# Patient Record
Sex: Female | Born: 1980 | Hispanic: Yes | Marital: Married | Smoking: Never smoker
Health system: Southern US, Community
[De-identification: ages and names within clinical notes are randomized; demographics above are authoritative.]

## PROBLEM LIST (undated history)

## (undated) DIAGNOSIS — K069 Disorder of gingiva and edentulous alveolar ridge, unspecified: Secondary | ICD-10-CM

---

## 2014-03-07 ENCOUNTER — Emergency Department (HOSPITAL_COMMUNITY)
Admission: EM | Admit: 2014-03-07 | Discharge: 2014-03-07 | Disposition: A | Discharge status: Home / Self Care | Payer: Self-pay | Attending: Emergency Medicine | Admitting: Emergency Medicine

## 2014-03-07 ENCOUNTER — Encounter (HOSPITAL_COMMUNITY): Payer: Self-pay | Admitting: Emergency Medicine

## 2014-03-07 ENCOUNTER — Emergency Department (HOSPITAL_COMMUNITY): Payer: Self-pay

## 2014-03-07 ENCOUNTER — Ambulatory Visit (INDEPENDENT_AMBULATORY_CARE_PROVIDER_SITE_OTHER): Payer: Self-pay | Admitting: Internal Medicine

## 2014-03-07 ENCOUNTER — Encounter: Payer: Self-pay | Admitting: Internal Medicine

## 2014-03-07 VITALS — BP 98/62 | HR 99 | Temp 98.4°F | Resp 10

## 2014-03-07 DIAGNOSIS — R55 Syncope and collapse: Secondary | ICD-10-CM | POA: Insufficient documentation

## 2014-03-07 DIAGNOSIS — R404 Transient alteration of awareness: Secondary | ICD-10-CM | POA: Insufficient documentation

## 2014-03-07 DIAGNOSIS — R6889 Other general symptoms and signs: Secondary | ICD-10-CM

## 2014-03-07 DIAGNOSIS — Z79899 Other long term (current) drug therapy: Secondary | ICD-10-CM | POA: Insufficient documentation

## 2014-03-07 DIAGNOSIS — R51 Headache: Secondary | ICD-10-CM

## 2014-03-07 DIAGNOSIS — R6883 Chills (without fever): Secondary | ICD-10-CM

## 2014-03-07 DIAGNOSIS — R42 Dizziness and giddiness: Secondary | ICD-10-CM | POA: Insufficient documentation

## 2014-03-07 DIAGNOSIS — R110 Nausea: Secondary | ICD-10-CM | POA: Insufficient documentation

## 2014-03-07 DIAGNOSIS — R4020 Unspecified coma: Secondary | ICD-10-CM

## 2014-03-07 DIAGNOSIS — R410 Disorientation, unspecified: Secondary | ICD-10-CM

## 2014-03-07 DIAGNOSIS — Z3202 Encounter for pregnancy test, result negative: Secondary | ICD-10-CM | POA: Insufficient documentation

## 2014-03-07 HISTORY — DX: Disorder of gingiva and edentulous alveolar ridge, unspecified: K06.9

## 2014-03-07 LAB — CBC WITH DIFFERENTIAL/PLATELET
Basophils Absolute: 0 10*3/uL (ref 0.0–0.1)
Basophils Relative: 0 % (ref 0–1)
Eosinophils Absolute: 0.1 10*3/uL (ref 0.0–0.7)
Eosinophils Relative: 1 % (ref 0–5)
HCT: 41.3 % (ref 36.0–46.0)
HEMOGLOBIN: 13.2 g/dL (ref 12.0–15.0)
LYMPHS ABS: 2.5 10*3/uL (ref 0.7–4.0)
LYMPHS PCT: 28 % (ref 12–46)
MCH: 24.8 pg — ABNORMAL LOW (ref 26.0–34.0)
MCHC: 32 g/dL (ref 30.0–36.0)
MCV: 77.5 fL — ABNORMAL LOW (ref 78.0–100.0)
Monocytes Absolute: 0.5 10*3/uL (ref 0.1–1.0)
Monocytes Relative: 6 % (ref 3–12)
NEUTROS PCT: 65 % (ref 43–77)
Neutro Abs: 5.9 10*3/uL (ref 1.7–7.7)
Platelets: 228 10*3/uL (ref 150–400)
RBC: 5.33 MIL/uL — AB (ref 3.87–5.11)
RDW: 13.5 % (ref 11.5–15.5)
WBC: 9 10*3/uL (ref 4.0–10.5)

## 2014-03-07 LAB — URINALYSIS, ROUTINE W REFLEX MICROSCOPIC
Bilirubin Urine: NEGATIVE
Glucose, UA: NEGATIVE mg/dL
HGB URINE DIPSTICK: NEGATIVE
Ketones, ur: NEGATIVE mg/dL
Leukocytes, UA: NEGATIVE
Nitrite: NEGATIVE
PH: 6 (ref 5.0–8.0)
Protein, ur: NEGATIVE mg/dL
SPECIFIC GRAVITY, URINE: 1.009 (ref 1.005–1.030)
UROBILINOGEN UA: 0.2 mg/dL (ref 0.0–1.0)

## 2014-03-07 LAB — I-STAT CHEM 8, ED
BUN: 6 mg/dL (ref 6–23)
CREATININE: 0.6 mg/dL (ref 0.50–1.10)
Calcium, Ion: 1.16 mmol/L (ref 1.12–1.23)
Chloride: 108 mEq/L (ref 96–112)
Glucose, Bld: 154 mg/dL — ABNORMAL HIGH (ref 70–99)
HCT: 42 % (ref 36.0–46.0)
HEMOGLOBIN: 14.3 g/dL (ref 12.0–15.0)
POTASSIUM: 3.9 meq/L (ref 3.7–5.3)
Sodium: 144 mEq/L (ref 137–147)
TCO2: 22 mmol/L (ref 0–100)

## 2014-03-07 LAB — PREGNANCY, URINE: PREG TEST UR: NEGATIVE

## 2014-03-07 MED ORDER — SODIUM CHLORIDE 0.9 % IV SOLN
1000.0000 mL | Freq: Once | INTRAVENOUS | Status: DC
Start: 2014-03-07 — End: 2014-03-07

## 2014-03-07 MED ORDER — ONDANSETRON HCL 4 MG/2ML IJ SOLN: 4.0000 mg | Freq: Once | INTRAMUSCULAR | Status: DC | PRN

## 2014-03-07 MED ORDER — MECLIZINE HCL 25 MG PO TABS: 25.0000 mg | ORAL_TABLET | Freq: Three times a day (TID) | ORAL | Status: AC | PRN

## 2014-03-07 MED ORDER — SODIUM CHLORIDE 0.9 % IV SOLN
1000.0000 mL | Freq: Once | INTRAVENOUS | Status: AC
Start: 2014-03-07 — End: 2014-03-07
  Administered 2014-03-07: 1000 mL via INTRAVENOUS

## 2014-03-07 MED ORDER — SODIUM CHLORIDE 0.9 % IV SOLN: 1000.0000 mL | INTRAVENOUS | Status: DC

## 2014-03-07 MED ORDER — ONDANSETRON HCL 4 MG PO TABS: 4.0000 mg | ORAL_TABLET | Freq: Four times a day (QID) | ORAL | Status: AC

## 2014-03-07 NOTE — ED Notes (Signed)
Patient transported to X-ray 

## 2014-03-07 NOTE — Progress Notes (Signed)
[facility] [employer],   Did not get to see patient but will be sending information on [facility] [employer] program to help patient establish primary care, using address provided.

## 2014-03-07 NOTE — ED Notes (Signed)
Bed: ZO10 Expected date: 03/07/14 Expected time: 12:08 PM Means of arrival: Ambulance Comments: EMS Possible sepsis

## 2014-03-07 NOTE — ED Notes (Signed)
Patient ambulated 40 feet to the bathroom with no assistance.

## 2014-03-07 NOTE — Progress Notes (Signed)
   Subjective:    Patient ID: Jessica Dillon, female    DOB: Jun 17, 1981, 33 y.o.   MRN: [id number]  HPI Emergency, in a coma! Emergency protocol, all docs and PAs involved IV started/Oxygen 2l nasal History for dental absces, tooth extraction, HC pain med and clindamycin. Passed out at work last Friday, 3d ago. Tooth extracted mid week 5-6 d ago. No pain med for 3d, went to work today and passed out again. Is partially arouseable.  Speaks only spanish    Review of Systems     Objective:   Physical Exam  Constitutional: She appears well-nourished. She appears lethargic. She appears distressed.  HENT:  Right Ear: External ear normal.  Left Ear: External ear normal.  Nose: Nose normal.  Mouth/Throat: Oropharynx is clear and moist.  Eyes: Conjunctivae and EOM are normal. Pupils are equal, round, and reactive to light. No scleral icterus.  Neck: Neck supple.  Cardiovascular: Normal rate, regular rhythm and normal heart sounds.   Pulmonary/Chest: Breath sounds normal. Bradypnea noted. No respiratory distress.  Abdominal: Soft. Bowel sounds are normal. There is tenderness.  Musculoskeletal: She exhibits tenderness.  Neurological: She has normal strength. She appears lethargic. She displays no tremor. No cranial nerve deficit or sensory deficit. Coordination and gait abnormal. She displays no Babinski's sign on the right side. She displays no Babinski's sign on the left side.  Reflex Scores:      Bicep reflexes are 3+ on the right side and 3+ on the left side.      Patellar reflexes are 3+ on the right side and 3+ on the left side. Skin: Skin is intact. No rash noted.    Glucose 171 Narcan trial was aroused only partially/speaks only spanish     Assessment & Plan:  Syncope/Recurrent/Disorientation/Persistent RO seizure/accidental opiode overdose/Sepsis/Intracranial lesion or mass/Metabolic disorder EMTs to ER STAT

## 2014-03-07 NOTE — Discharge Instructions (Signed)
Mareos °(Dizziness) °Los mareos son un problema muy frecuente. Se trata de una sensación de inestabilidad o aturdimiento. Puede sentir que se va a desvanecer. Puede provocarle una lesión si se tropieza o se cae. Las personas de cualquier edad pueden sufrir mareos, pero es más frecuente en los ancianos. °CAUSAS  °La causa puede deberse a muchos problemas diferentes: °· Problemas en el oído medio. °· Estar de pie durante mucho tiempo. °· Infecciones. °· Reacciones alérgicas. °· Envejecimiento. °· Respuesta emocional a distintas cosas, como por ejemplo la visión de sangre. °· Efectos secundarios de un medicamento. °· Cansancio. °· Problemas circulatorios o de presión arterial. °· Consumo excesivo de alcohol, medicamentos o drogas. °· Respiración muy rápida (hiperventilación). °· Ritmo cardíaco irregular (arritmia). °· Bajo recuento de glóbulos rojos (anemia). °· Embarazo. °· Vómitos, diarrea, fiebre u otras enfermedades que causan la pérdida de líquidos (deshidratación). °· Enfermedades o trastornos, como enfermedad de Parkinson, presión alta (hipertensión arterial), diabetes y problemas tiroideos. °· Exposición al calor extremo. °DIAGNÓSTICO  °El médico le preguntará acerca de los síntomas, y realizará un examen físico y un electrocardiograma (ECG) para registrar la actividad eléctrica del corazón. También podrá realizarle otras pruebas cardíacas o análisis de sangre para determinar la causa de los mareos. Estos pueden ser: °· Ecocardiograma transtorácico (ETT). Durante el ecocardiograma, se usan ondas sonoras para evaluar cómo fluye la sangre por el corazón. °· Ecocardiograma transesofágico (ETE). °· Monitoreo cardíaco. Este estudio permite que el médico controle la frecuencia y el ritmo cardíaco en tiempo real. °· Monitor Holter. Es un dispositivo portátil que registra los latidos cardíacos y ayuda a diagnosticar las arritmias cardíacas. Le permite al médico registrar la actividad cardíaca durante varios días, si es  necesario. °· Pruebas de estrés por ejercicio o por medicamentos que aceleran los latidos cardíacos. °TRATAMIENTO  °El tratamiento de los mareos depende de la causa de los síntomas y puede variar mucho. °INSTRUCCIONES PARA EL CUIDADO EN EL HOGAR  °· Beba suficiente líquido para mantener la orina clara o de color amarillo pálido. Esto es realmente importante cuando el clima es muy caluroso. En los adultos mayores, también es importante cuando hace frío. °· Tome los medicamentos exactamente como se lo hayan indicado, si los mareos se deben a los medicamentos. Cuando tome medicamentos para la presión arterial, es muy importante que se levante lentamente. °¨ Levántese de las sillas con lentitud y apóyese hasta sentirse bien. °¨ Por la mañana, siéntese primero a un lado de la cama. Cuando se sienta bien, póngase lentamente de pie mientras se sostiene de algo, hasta que sepa que ha logrado el equilibrio. °· Mueva las piernas con frecuencia si debe estar de pie en un lugar durante mucho tiempo. Mientras está de pie, contraiga y relaje los músculos de las piernas. °· Pídale a alguna persona que lo acompañe durante 1 o 2 días si los mareos siguen siendo un problema. Debe estar acompañado hasta que se sienta lo suficientemente bien como para estar solo. Pídale a la persona que llame al médico si observa cambios en usted que sean preocupantes. °· No conduzca vehículos ni utilice maquinarias pesadas si se siente mareado. °· No beba alcohol. °SOLICITE ATENCIÓN MÉDICA DE INMEDIATO SI:  °· Los mareos o el aturdimiento empeoran. °· Siente náuseas o vomita. °· Tiene dificultad para hablar, para caminar o para usar los brazos, las manos o las piernas. °· Se siente débil. °· No piensa con claridad o tiene dificultades para armar oraciones. Es posible que un amigo o un familiar adviertan que esto   ocurre.  Tiene dolor de pecho, dolor abdominal, sudoracin o Company secretary.  Hay cambios en la visin.  Observa un  sangrado.  Tiene efectos secundarios del medicamento que parecen Consulting civil engineer de Scientist, clinical (histocompatibility and immunogenetics). ASEGRESE DE QUE:   Comprende estas instrucciones.  Controlar su afeccin.  Recibir ayuda de inmediato si no mejora o si empeora. Document Released: 07/01/2005 Document Revised: 07/06/2013 [facility] Patient Information 2015 [place], [place]. This information is not intended to replace advice given to you by your health care provider. Make sure you discuss any questions you have with your health care provider.  Vrtigo (Vertigo)  Vrtigo es la sensacin de que se est moviendo estando quieto. Puede ser peligroso si ocurre cuando est trabajado, conduciendo vehculos o realizando actividades difciles.  CAUSAS  El vrtigo se produce cuando hay un conflicto en las seales que se envan al cerebro desde los sistemas visual y sensorial del cuerpo. Hay numerosas causas que [employer], entre las que se incluyen:   Infecciones, especialmente en el odo interno.  Nelia Shi reaccin a un medicamento o mal uso de alcohol y frmacos.  Abstinencia de drogas o alcohol.  Cambios rpidos de posicin, como al [employer] o darse vuelta en la cama.  Dolor de Surveyor, minerals.  Disminucin del flujo sanguneo hacia el cerebro.  Aumento de la presin en el cerebro por un traumatismo, infeccin, tumor o sangrado en la cabeza. SNTOMAS  Puede sentir como si el mundo da vueltas o va a caer al piso. Como hay problemas en el equilibrio, el vrtigo puede causar nuseas y vmitos. Tiene movimientos oculares involuntarios (nistagmus).  DIAGNSTICO  El vrtigo normalmente se diagnostica con un examen fsico. Si la causa no se conoce, el mdico puede indicar diagnstico por imgenes, como una resonancia magntica (imgenes por Health visitor).  TRATAMIENTO  La mayor parte de los casos de vrtigo se resuelve sin TEFL teacher. Segn la causa, el mdico podr recetar ciertos medicamentos. Si se relaciona  con la posicin del cuerpo, podr recomendarle movimientos o procedimientos para corregir el problema. En algunos casos raros, si la causa del vrtigo es un problema en el odo interno, necesitar [place].  INSTRUCCIONES PARA EL CUIDADO DOMICILIARIO  Siga las indicaciones del mdico.  Evite conducir vehculos.  Evite operar maquinarias pesadas.  Evite realizar tareas que seran peligrosas para usted u otras personas durante un episodio de vrtigo.  Comunquele al mdico si nota que ciertos medicamentos parecen asociarse con las crisis. Algunos medicamentos que se usan para tratar los episodios, en [employer]. SOLICITE ATENCIN MDICA DE INMEDIATO SI:  Los medicamentos no [place] las crisis o hacen que estas empeoren.  Tiene dificultad para hablar, caminar, siente debilidad o tiene problemas para [employer], las manos o las piernas.  Comienza a sufrir un dolor de cabeza intenso.  Las nuseas y los vmitos no se [place] o se Press photographer.  Aparecen trastornos visuales.  Un miembro de su familia nota cambios en su conducta.  Hay alguna modificacin en su trastorno que parece Holiday representative de Scientist, clinical (histocompatibility and immunogenetics). ASEGRESE DE QUE:   Comprende estas instrucciones.  Controlar su enfermedad.  Solicitar ayuda de inmediato si no mejora o si empeora. Document Released: 04/10/2005 Document Revised: 09/23/2011 [facility] Patient Information 2015 [place], [place]. This information is not intended to replace advice given to you by your health care provider. Make sure you discuss any questions you have with your health care provider.     Resource Guide (Spanish)   Problemas  Dentales  Pacientes con Medicaid    :                                                               [facility] [phone] [address]                          [phone] [address] [place], [place]                          [place], [place]                                              Phone:  [phone]               Phone:  [phone] Si usted no puede pagar o no tiene seguro mdico/dental pngase en contacto con: [facility] o el departamento de salud de [place] para que le autoricen el uso de la Therapist, art para adultos.  Problemas con Dolor Crnico Pngase en contacto con la clinica de dolores cronicos en el hospital de [place], [phone].   Los pacientes deben ser [employer] por su medico de cabecera.  Si usted no tiene dinero para pagar sus medicinas Pngase en contacto con [employer]:  llame al "211" o Insurance underwriter.   Phone: [phone]  Si usted no tiene mdico de [employer]      Phone: [phone]. Otras agencias que ofrecen cuidado mdico barato son:      [facility] [place]  [phone]      [facility]  [phone]      [facility]  [phone]      [facility]  [phone]      [facility]  [phone]      [facility]  [phone]  [facility]  [phone] [facility]  [phone] [facility]  [phone]  (servicios de emergencia [phone])  Abuse/Neglect [facility] Child Abuse Hotline [phone] [facility] Child Abuse Hotline [phone] (After Hours)  Emergency Shelter [facility] [phone]  Maternity Homes Room at the [place] [facility] [place] [phone] [facility] [phone]  MRSA Hotline #:   606-108-7965  Servicios de [facility] [place]    United Way           [facility] [address]                                    [address]       [address] [place]                                         Michell Heinrich  Michell Heinrich Phone:  [phone]                            Phone: [phone]                Phone: [phone]  [facility] de Ps[facility]ologa Phone:   [phone]  [facility] Child Abuse Hotline 928-443-0606 [phone] (After Hours)

## 2014-03-07 NOTE — ED Provider Notes (Signed)
CSN: [id number]     Arrival date & time 03/07/14  1222 History   First MD Initiated Contact with Patient 03/07/14 1226     Chief Complaint  Patient presents with  . Loss of Consciousness   History is obtained using a Spanish interpreter HPI The patient presents here in the emergency room after an episode of syncope. The patient works outside on a farm. Patient states she number suddenly feeling very hot and nauseated. She became lightheaded and then passed out. She was found on the ground by coworkers. Originally they brought her to an urgent care. From the urgent care she was sent here to the emergency room for further evaluation. According to EMS but she was losing consciousness again. She was given 0.4 mg of Narcan with minimal response. Patient states she had tooth surgery last week. She was prescribed pain medications. She is not sure she's having some side effects associated with that. She denies any trouble with chest pain or abdominal pain. She has had a mild headache. She denies any trouble with fevers. Patient feels somewhat better now than she did earlier. Past Medical History  Diagnosis Date  . Gum disease    History reviewed. No pertinent past surgical history. History reviewed. No pertinent family history. History  Substance Use Topics  . Smoking status: Never Smoker   . Smokeless tobacco: Not on file  . Alcohol Use: No   OB History   Grav Para Term Preterm Abortions TAB SAB Ect Mult Living                 Review of Systems  All other systems reviewed and are negative.     Allergies  Review of patient's allergies indicates no known allergies.  Home Medications   Prior to Admission medications   Medication Sig Start Date End Date Taking? Authorizing Provider  meclizine (ANTIVERT) 25 MG tablet Take 1 tablet (25 mg total) by mouth 3 (three) times daily as needed for dizziness or nausea. 03/07/14   Linwood Dibbles, MD  ondansetron (ZOFRAN) 4 MG tablet Take 1 tablet (4 mg  total) by mouth every 6 (six) hours. 03/07/14   Linwood Dibbles, MD   BP 114/64  Pulse 105  Temp(Src) 98.9 F (37.2 C) (Oral)  Resp 15  SpO2 99% Physical Exam  Nursing note and vitals reviewed. Constitutional: She is oriented to person, place, and time. No distress.  HENT:  Head: Normocephalic and atraumatic.  Right Ear: Tympanic membrane, external ear and ear canal normal.  Left Ear: Tympanic membrane, external ear and ear canal normal.  Mouth/Throat: No trismus in the jaw. No uvula swelling. No oropharyngeal exudate, posterior oropharyngeal edema or tonsillar abscesses.  Eyes: Conjunctivae are normal. Right eye exhibits no discharge. Left eye exhibits no discharge. No scleral icterus.  Neck: Neck supple. No tracheal deviation present.  Cardiovascular: Normal rate, regular rhythm and intact distal pulses.   Pulmonary/Chest: Effort normal and breath sounds normal. No stridor. No respiratory distress. She has no wheezes. She has no rales.  Abdominal: Soft. Bowel sounds are normal. She exhibits no distension. There is no tenderness. There is no rebound and no guarding.  Musculoskeletal: She exhibits no edema and no tenderness.  Neurological: She is alert and oriented to person, place, and time. She has normal strength. She is not disoriented. No cranial nerve deficit (no facial droop, extraocular movements intact, no slurred speech) or sensory deficit. She exhibits normal muscle tone. She displays no seizure activity. Coordination normal. GCS eye  subscore is 4. GCS verbal subscore is 5. GCS motor subscore is 6.  Patient's able to lift both arms off the bed, no pronator drift; able to lift each leg off the bed without drift, sensation is intact in all extremities  Skin: Skin is warm and dry. No rash noted. She is not diaphoretic.  Psychiatric: She has a normal mood and affect.    ED Course  Procedures (including critical care time) Labs Review Labs Reviewed  CBC WITH DIFFERENTIAL - Abnormal;  Notable for the following:    RBC 5.33 (*)    MCV 77.5 (*)    MCH 24.8 (*)    All other components within normal limits  I-STAT CHEM 8, ED - Abnormal; Notable for the following:    Glucose, Bld 154 (*)    All other components within normal limits  URINALYSIS, ROUTINE W REFLEX MICROSCOPIC  PREGNANCY, URINE    Imaging Review Dg Chest 2 View  03/07/2014   CLINICAL DATA:  Loss of consciousness, dizziness  EXAM: CHEST  2 VIEW  COMPARISON:  None.  FINDINGS: The heart size and mediastinal contours are within normal limits. Both lungs are clear. The visualized skeletal structures are unremarkable.  IMPRESSION: No active cardiopulmonary disease.   Electronically Signed   By: Esperanza Heir M.D.   On: 03/07/2014 13:35     EKG Interpretation   Date/Time:  Monday March 07 2014 12:29:48 EDT Ventricular Rate:  78 PR Interval:  141 QRS Duration: 84 QT Interval:  376 QTC Calculation: 428 R Axis:   34 Text Interpretation:  Sinus rhythm Baseline wander in lead(s) V6 Confirmed  by Aaro Meyers  MD-J, Sequoia Mincey ([id number]) on 03/07/2014 12:40:38 PM      MDM   Final diagnoses:  Syncope, unspecified syncope type  Vertigo   The patient's symptoms have improved. She was able to describe a little bit more that she is having a sensation that the room was spinning. She's also had sensation last few days that something is in her left ear. I suspect her symptoms are related to peripheral vertigo. She's not having any focal neurologic symptoms to suggest a stroke.  Patiently discharged home with prescriptions for meclizine and Zofran. I recommended followup with a primary care doctor.  Linwood Dibbles, MD 03/07/14 [phone]

## 2014-03-07 NOTE — ED Notes (Signed)
Per EMS. 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 Was initially brought to to urgent care. EMS states pt has been in and out of consciousness with them. Was given 0.4 narcan which only improved respirations. Pt alert and oriented on arrival. States she got dizzy and passed out at work. Pt had tooth surgery last week, but has not had hydrocodone today. No neuro deficits noted. Was given NS prior to arrival.

## 2015-02-06 IMAGING — CR DG CHEST 2V
2 series · 2 of 2 positions shown · non-contrast
Comparison: None.

CLINICAL DATA: Loss of consciousness, dizziness

EXAM:
CHEST  2 VIEW

[w chest pa]
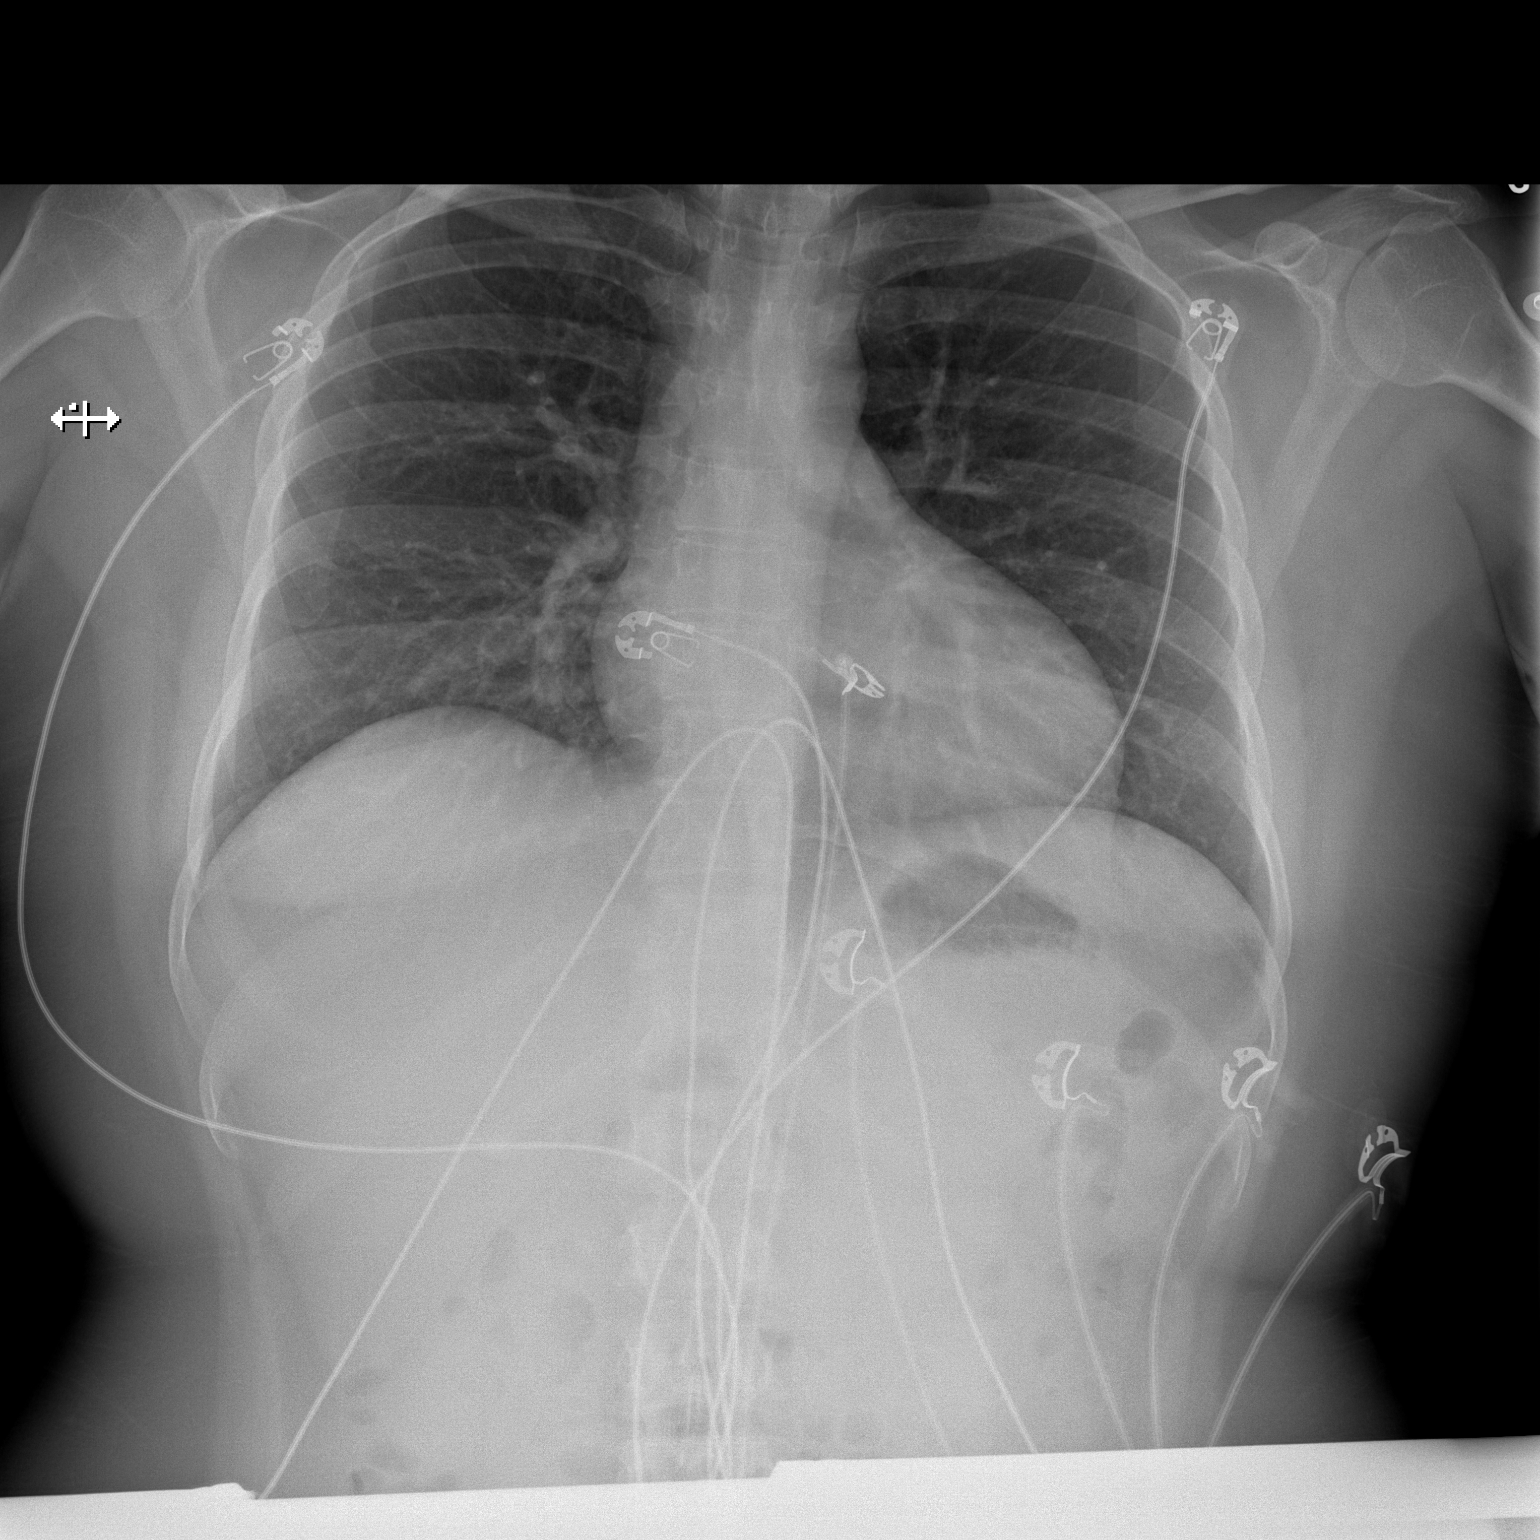

[w chest lat]
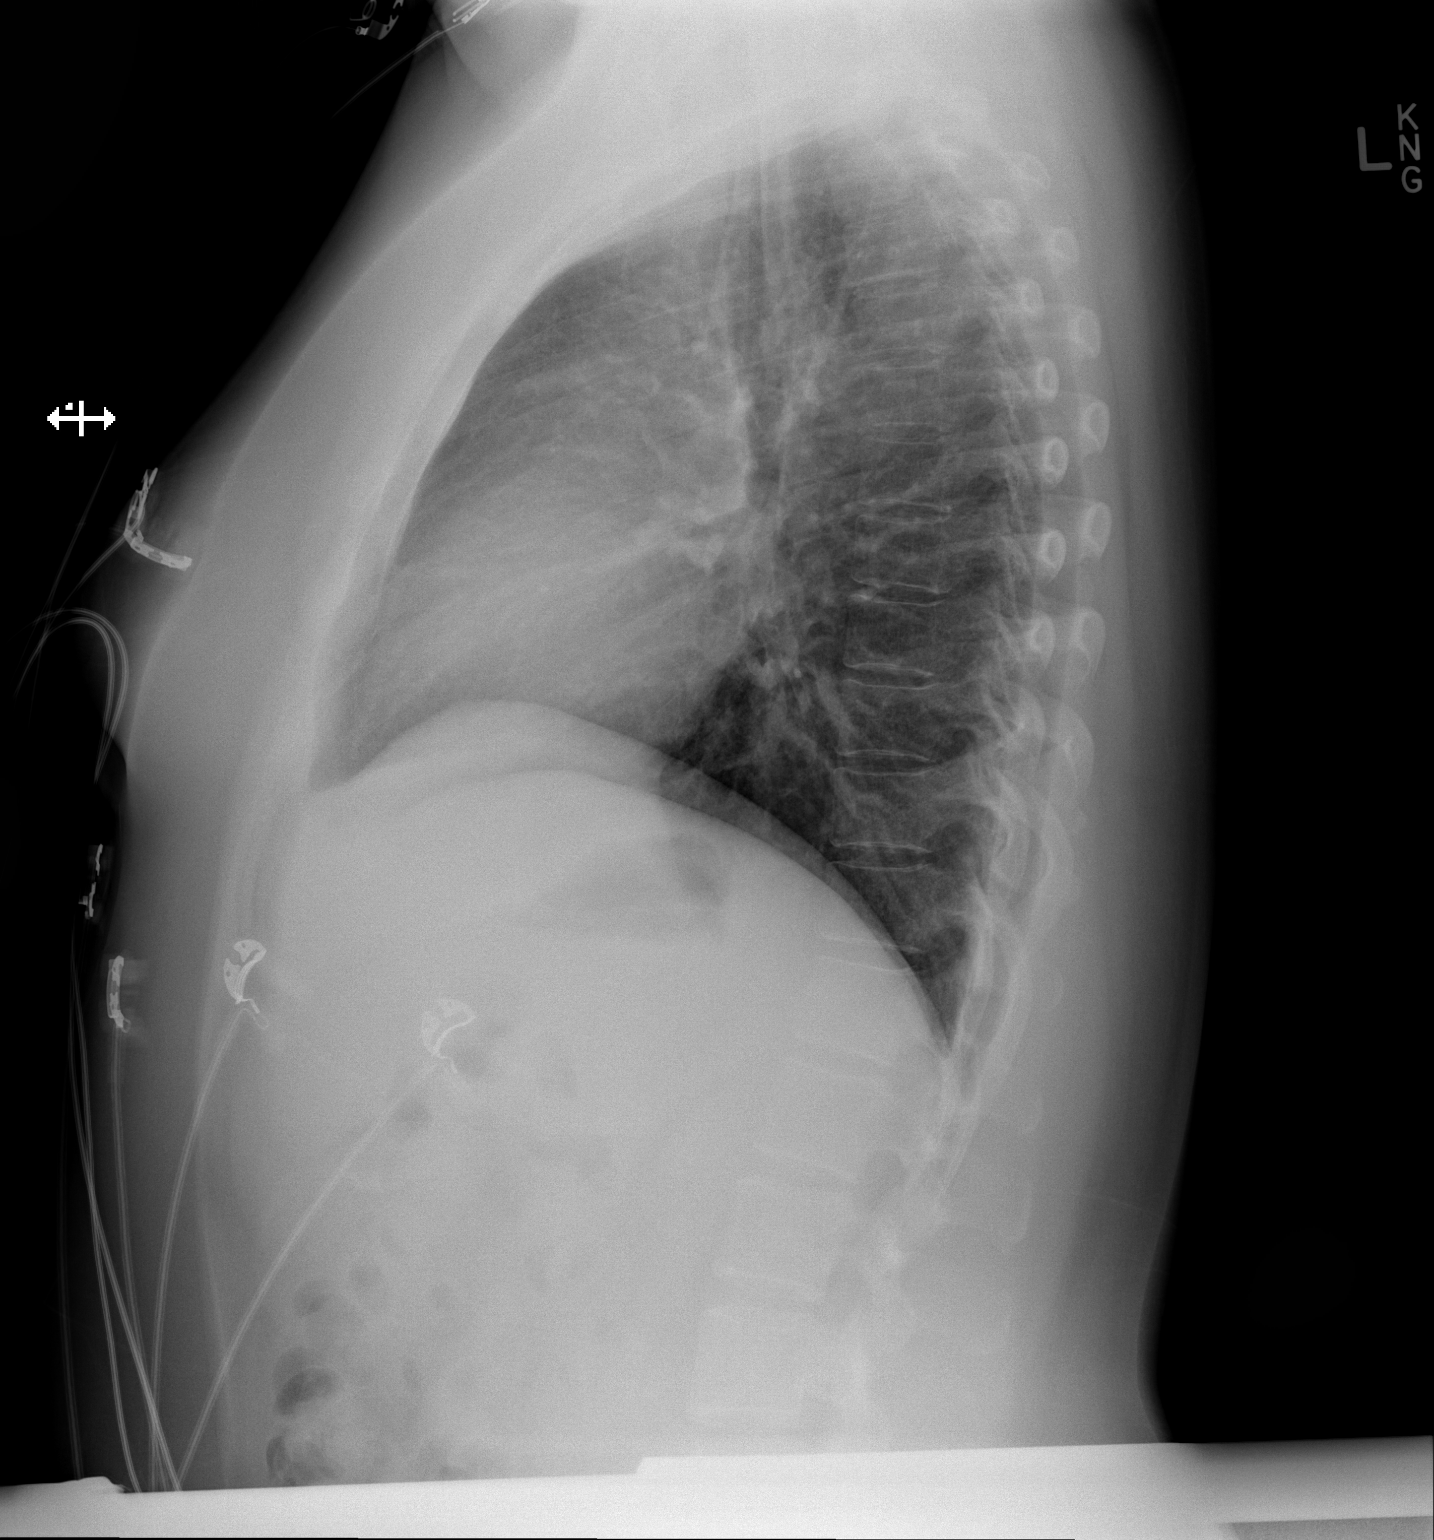

[2 of 2 positions shown; findings below may reference images not displayed]

FINDINGS: The heart size and mediastinal contours are within normal limits.
Both lungs are clear. The visualized skeletal structures are
unremarkable.
IMPRESSION: No active cardiopulmonary disease.
